# Patient Record
Sex: Female | Born: 1990 | Race: Black or African American | Hispanic: No | Marital: Single | Smoking: Never smoker
Health system: Southern US, Community
[De-identification: ages and names within clinical notes are randomized; demographics above are authoritative.]

## PROBLEM LIST (undated history)

## (undated) HISTORY — PX: TONSILLECTOMY: SUR1361

---

## 2013-09-18 ENCOUNTER — Ambulatory Visit (INDEPENDENT_AMBULATORY_CARE_PROVIDER_SITE_OTHER): Payer: Self-pay | Admitting: General Surgery

## 2013-09-21 ENCOUNTER — Encounter (INDEPENDENT_AMBULATORY_CARE_PROVIDER_SITE_OTHER): Payer: Self-pay | Admitting: General Surgery

## 2013-09-21 ENCOUNTER — Ambulatory Visit (INDEPENDENT_AMBULATORY_CARE_PROVIDER_SITE_OTHER): Payer: Private Health Insurance - Indemnity | Admitting: General Surgery

## 2013-09-21 VITALS — BP 110/60 | HR 70 | Resp 16 | Ht 71.0 in | Wt 255.0 lb

## 2013-09-21 DIAGNOSIS — L0501 Pilonidal cyst with abscess: Secondary | ICD-10-CM

## 2013-09-21 NOTE — Progress Notes (Signed)
Subjective:     Patient ID: Alexis Patterson, female   DOB: August 19, 1990, 23 y.o.   MRN: [id number]  HPI The patient is a 23 year old female who was [facility] by [facility] for an evaluation of a pilonidal cyst. Patient states that the infection started approximately a month ago. She said she had to strain approximately one week and to the infection. She is treated with by mouth antibiotics and has since resolved. She states she's had this once previously in the past but did not require incision and drainage.  She has no tenderness or drainage at this time.  Review of Systems  Constitutional: Negative.   HENT: Negative.   Respiratory: Negative.   Cardiovascular: Negative.   Gastrointestinal: Negative.   Neurological: Negative.   All other systems reviewed and are negative.       Objective:   Physical Exam  Constitutional: She is oriented to person, place, and time. She appears well-developed and well-nourished.  HENT:  Head: Normocephalic and atraumatic.  Eyes: Conjunctivae and EOM are normal. Pupils are equal, round, and reactive to light.  Neck: Normal range of motion. Neck supple.  Cardiovascular: Normal rate, regular rhythm and normal heart sounds.   Pulmonary/Chest: Effort normal and breath sounds normal.  Abdominal: Soft. Bowel sounds are normal. She exhibits no distension and no mass. There is no tenderness. There is no rebound and no guarding.  Musculoskeletal: Normal range of motion.  Neurological: She is alert and oriented to person, place, and time.  Skin: Skin is warm and dry.     Psychiatric: She has a normal mood and affect.       Assessment:     23 year old female with a pilonidal cyst and midline pits. I discussed with her the pathophysiology of the pilonidal cyst the likelihood that this will likely return in the future. We also discussed the possible procedures involved and the likelihood of delayed wound healing. At this time she would like to  discuss any future surgery with her parents initially impossibly plants around her school schedule.     Plan:     1. We'll have patient follow back up as needed

## 2014-04-02 ENCOUNTER — Emergency Department (HOSPITAL_COMMUNITY): Payer: Managed Care, Other (non HMO)

## 2014-04-02 ENCOUNTER — Emergency Department (HOSPITAL_COMMUNITY)
Admission: EM | Admit: 2014-04-02 | Discharge: 2014-04-03 | Disposition: A | Discharge status: Home / Self Care | Payer: Managed Care, Other (non HMO) | Attending: Emergency Medicine | Admitting: Emergency Medicine

## 2014-04-02 ENCOUNTER — Encounter (HOSPITAL_COMMUNITY): Payer: Self-pay | Admitting: Emergency Medicine

## 2014-04-02 DIAGNOSIS — Y9289 Other specified places as the place of occurrence of the external cause: Secondary | ICD-10-CM | POA: Insufficient documentation

## 2014-04-02 DIAGNOSIS — S99919A Unspecified injury of unspecified ankle, initial encounter: Secondary | ICD-10-CM

## 2014-04-02 DIAGNOSIS — X500XXA Overexertion from strenuous movement or load, initial encounter: Secondary | ICD-10-CM | POA: Diagnosis not present

## 2014-04-02 DIAGNOSIS — S8990XA Unspecified injury of unspecified lower leg, initial encounter: Secondary | ICD-10-CM | POA: Insufficient documentation

## 2014-04-02 DIAGNOSIS — Y9368 Activity, volleyball (beach) (court): Secondary | ICD-10-CM | POA: Diagnosis not present

## 2014-04-02 DIAGNOSIS — S86912A Strain of unspecified muscle(s) and tendon(s) at lower leg level, left leg, initial encounter: Secondary | ICD-10-CM

## 2014-04-02 DIAGNOSIS — S99929A Unspecified injury of unspecified foot, initial encounter: Secondary | ICD-10-CM

## 2014-04-02 DIAGNOSIS — IMO0002 Reserved for concepts with insufficient information to code with codable children: Secondary | ICD-10-CM | POA: Diagnosis not present

## 2014-04-02 DIAGNOSIS — M25562 Pain in left knee: Secondary | ICD-10-CM

## 2014-04-02 MED ORDER — HYDROCODONE-ACETAMINOPHEN 5-325 MG PO TABS
2.0000 | ORAL_TABLET | Freq: Once | ORAL | Status: AC
  Administered 2014-04-02: 2 via ORAL
  Filled 2014-04-02: qty 2

## 2014-04-02 NOTE — ED Provider Notes (Signed)
CSN: [id number]     Arrival date & time 04/02/14  2151 History  This chart was scribed for non-physician practitioner working with No att. providers found by Elveria Rising, ED Scribe. This patient was seen in room WTR8/WTR8 and the patient's care was started at 11:32 PM.   Chief Complaint  Patient presents with  . Knee Pain    The history is provided by the patient. No language interpreter was used.   HPI Comments: Alexis Patterson is a 23 y.o. female who presents to the Emergency Department with left knee injury sustained while playing volleyball today 2.5 hours ago. She reports landing on an inverted left foot and feeling her left knee buckle outward. Patient is experiencing pain and swelling. She denies loss of sensation in her left lower leg. Patient has not attempted ambulating since the injury, due to pain severity. Patient reports treatment with ice.   History reviewed. No pertinent past medical history. Past Surgical History  Procedure Laterality Date  . Tonsillectomy     Family History  Problem Relation Age of Onset  . Hypertension Father    History  Substance Use Topics  . Smoking status: Never Smoker   . Smokeless tobacco: Not on file  . Alcohol Use: No   OB History   Grav Para Term Preterm Abortions TAB SAB Ect Mult Living                  Review of Systems  Musculoskeletal: Positive for arthralgias.  Neurological: Negative for weakness and numbness.  All other systems reviewed and are negative.   Allergies  Review of patient's allergies indicates no known allergies.  Home Medications   Prior to Admission medications   Medication Sig Start Date End Date Taking? Authorizing Provider  aspirin-acetaminophen-caffeine (EXCEDRIN MIGRAINE) [phone] MG per tablet Take 1 tablet by mouth every 6 (six) hours as needed for headache.   Yes Historical Provider, MD  HYDROcodone-acetaminophen (NORCO/VICODIN) 5-325 MG per tablet Take 1-2 tablets by mouth every 6 (six) hours as  needed for moderate pain or severe pain. 04/03/14   Antony Madura, PA-C  naproxen (NAPROSYN) 500 MG tablet Take 1 tablet (500 mg total) by mouth 2 (two) times daily. 04/03/14   Antony Madura, PA-C   Triage Vitals: BP 139/70  Pulse 86  Temp(Src) 98.4 F (36.9 C) (Oral)  Resp 18  SpO2 95%  LMP 03/29/2014  Physical Exam  Nursing note and vitals reviewed. Constitutional: She is oriented to person, place, and time. She appears well-developed and well-nourished. No distress.  Nontoxic/nonseptic appearing  HENT:  Head: Normocephalic and atraumatic.  Eyes: Conjunctivae and EOM are normal. No scleral icterus.  Neck: Normal range of motion.  Cardiovascular: Normal rate, regular rhythm and intact distal pulses.   DP and PT pulses 2+ in left lower extremity  Pulmonary/Chest: Effort normal. No respiratory distress.  Musculoskeletal:       Left knee: She exhibits decreased range of motion and swelling. She exhibits no effusion, no deformity, normal alignment and no LCL laxity. Tenderness found. Medial joint line and MCL tenderness noted.  TTP along medial joint line and MCL with mild swelling. No effusion or crepitus. Near full PROM with limited AROM secondary to pain. 5/5 strength against resistance with extension of L knee; 4/5 with flexion.  Neurological: She is alert and oriented to person, place, and time. She exhibits normal muscle tone. Coordination normal.  No gross sensory deficits appreciated  Skin: Skin is warm and dry. No rash noted. She  is not diaphoretic. No erythema. No pallor.  Psychiatric: She has a normal mood and affect. Her behavior is normal.    ED Course  Procedures (including critical care time)  COORDINATION OF CARE: 11:34 PM- Will order knee sleeve and crutches while awaiting imaging. Discussed treatment plan with patient at bedside and patient agreed to plan.   Labs Review Labs Reviewed - No data to display  Imaging Review Dg Knee Complete 4 Views Left  04/03/2014    CLINICAL DATA:  KNEE PAIN. Twisting injury, with anterior knee pain under the patella.  EXAM: LEFT KNEE - COMPLETE 4+ VIEW  COMPARISON:  None.  FINDINGS: Small curvilinear calcific density along the posterior margin of the proximal tibia on the lateral projection. Otherwise, no displaced fracture or dislocation. No aggressive osseous lesion or overt degenerative change. No joint effusion.  IMPRESSION: Small calcific density along the posterior margin of the proximal tibia. This can be sequelae of an age-indeterminate avulsion injury. Correlate for point tenderness.   Electronically Signed   By: Jearld Lesch M.D.   On: 04/03/2014 00:18     EKG Interpretation None      MDM   Final diagnoses:  Medial knee pain, left  Knee strain, left, initial encounter    23 year old female presents to the emergency department for left knee pain after an injury while playing volleyball. Patient neurovascularly intact. No gross sensory deficits appreciated. No crepitus or deformity. No effusion. There is mild swelling along the medial aspect of the left knee with tenderness at the medial joint line and along the MCL. Xray with questionable avulsion of posterior margin of proximal tibia; no aggressive osseous injury.  Patient given knee sleeve and crutches in ED. She is stable for discharge with instruction to followup with an orthopedist. Referral provided. Instruction on RICE and naproxen given and return precautions discussed. Patient agreeable to plan with no unaddressed concerns.  I personally performed the services described in this documentation, which was scribed in my presence. The recorded information has been reviewed and is accurate.   Filed Vitals:   04/02/14 2236  BP: 139/70  Pulse: 86  Temp: 98.4 F (36.9 C)  TempSrc: Oral  Resp: 18  SpO2: 95%     Antony Madura, PA-C 04/03/14 [phone]

## 2014-04-02 NOTE — ED Notes (Signed)
Pt states she injured her left knee playing volleyball today  Pt has swelling noted  Ice pack in place

## 2014-04-03 MED ORDER — NAPROXEN 500 MG PO TABS: 500.0000 mg | ORAL_TABLET | Freq: Two times a day (BID) | ORAL | Status: AC

## 2014-04-03 MED ORDER — ONDANSETRON 8 MG PO TBDP
8.0000 mg | ORAL_TABLET | Freq: Once | ORAL | Status: AC
  Administered 2014-04-03: 8 mg via ORAL
  Filled 2014-04-03: qty 2

## 2014-04-03 MED ORDER — HYDROCODONE-ACETAMINOPHEN 5-325 MG PO TABS: 1.0000 | ORAL_TABLET | Freq: Four times a day (QID) | ORAL | Status: DC | PRN

## 2014-04-03 NOTE — Discharge Instructions (Signed)
Suspect that you have a strain to your medial collateral ligament. Recommend you wear a knee sleeve for stability. Use crutches when walking to prevent from putting weight on your left leg. Continue with elevation, ice to the area 3-4 times per day, and rest. Take naproxen for pain control. You may take Norco if pain is severe. Do not take Tylenol with Norco as there is already Tylenol and this medication. Followup with orthopedics for further evaluation of your injury. Return to the emergency department as needed if symptoms worsen.  Medial Collateral Knee Ligament Sprain  with Phase I Rehab The medial collateral ligament (MCL) of the knee helps hold the knee joint in proper alignment and prevents the bones from shifting out of alignment (displacing) to the inside (medially). Injury to the knee may cause a tear in the MCL ligament (sprain). Sprains may heal without treatment, but this often results in a loose joint. Sprains are classified into three categories. Grade 1 sprains cause pain, but the tendon is not lengthened. Grade 2 sprains include a lengthened ligament, due to the ligament being stretched or partially ruptured. With grade 2 sprains, there is still function, although possibly decreased. Grade 3 sprains involve a complete tear of the tendon or muscle, and function is usually impaired. SYMPTOMS   Pain and tenderness on the inner side of the knee.  A "pop," tearing or pulling sensation at the time of injury.  Bruising (contusion) at the site of injury, within 48 hours of injury.  Knee stiffness.  Limping, often walking with the knee bent. CAUSES  An MCL sprain occurs when a force is placed on the ligament that is greater than it can handle. Common mechanisms of injury include:  Direct hit (trauma) to the outer side of the knee, especially if the foot is planted on the ground.  Forceful pivoting of the body and leg, while the foot is planted on the ground. RISK INCREASES  WITH:  Contact sports (football, rugby).  Sports that require pivoting or cutting (soccer).  Poor knee strength and flexibility.  Improper equipment use. PREVENTION  Warm up and stretch properly before activity.  Maintain physical fitness:  Strength, flexibility and endurance.  Cardiovascular fitness.  Wear properly fitted protective equipment (correct length of cleats for surface).  Functional braces may be effective in preventing injury. PROGNOSIS  MCL tears usually heal without the need for surgery. Sometimes however, surgery is required. RELATED COMPLICATIONS  Frequently recurring symptoms, such as the knee giving way, knee instability or knee swelling.  Injury to other structures in the knee joint:  Meniscal cartilage, resulting in locking and swelling of the knee.  Articular cartilage, resulting in knee arthritis.  Other ligaments of the knee.  Injury to nerves, resulting in numbness of the outer leg, foot or ankle and weakness or paralysis, with inability to raise the ankle or toes.  Knee stiffness. TREATMENT Treatment first involves the use of ice and medicine, to reduce pain and inflammation. The use of strengthening and stretching exercises may help reduce pain with activity. These exercises may be performed at home, but referral to a therapist is often advised. You may be advised to walk with crutches until you are able to walk without a limp. Your caregiver may provide you with a hinged knee brace to help regain a full range of motion, while also protecting the injured knee. For severe MCL injuries or injuries that involve other ligaments of the knee, surgery is often advised. MEDICATION  Do not take  pain medicine for 7 days before surgery.  Only use over-the-counter pain medicine as directed by your caregiver.  Only use prescription pain relievers as directed and only in needed amounts. HEAT AND COLD  Cold treatment (icing) should be applied for 10 to 15  minutes every 2 to 3 hours for inflammation and pain, and immediately after any activity, that aggravates the symptoms. Use ice packs or an ice massage.  Heat treatment may be used before performing stretching and strengthening activities prescribed by your caregiver, physical therapist or athletic trainer. Use a heat pack or warm water soak. SEEK MEDICAL CARE IF:   Symptoms get worse or do not improve in 4 to 6 weeks, despite treatment.  New, unexplained symptoms develop. RICE: Routine Care for Injuries The routine care of many injuries includes Rest, Ice, Compression, and Elevation (RICE). HOME CARE INSTRUCTIONS  Rest is needed to allow your body to heal. Routine activities can usually be resumed when comfortable. Injured tendons and bones can take up to 6 weeks to heal. Tendons are the cord-like structures that attach muscle to bone.  Ice following an injury helps keep the swelling down and reduces pain.  Put ice in a plastic bag.  Place a towel between your skin and the bag.  Leave the ice on for 15-20 minutes, 3-4 times a day, or as directed by your health care provider. Do this while awake, for the first 24 to 48 hours. After that, continue as directed by your caregiver.  Compression helps keep swelling down. It also gives support and helps with discomfort. If an elastic bandage has been applied, it should be removed and reapplied every 3 to 4 hours. It should not be applied tightly, but firmly enough to keep swelling down. Watch fingers or toes for swelling, bluish discoloration, coldness, numbness, or excessive pain. If any of these problems occur, remove the bandage and reapply loosely. Contact your caregiver if these problems continue.  Elevation helps reduce swelling and decreases pain. With extremities, such as the arms, hands, legs, and feet, the injured area should be placed near or above the level of the heart, if possible. SEEK IMMEDIATE MEDICAL CARE IF:  You have  persistent pain and swelling.  You develop redness, numbness, or unexpected weakness.  Your symptoms are getting worse rather than improving after several days. These symptoms may indicate that further evaluation or further X-rays are needed. Sometimes, X-rays may not show a small broken bone (fracture) until 1 week or 10 days later. Make a follow-up appointment with your caregiver. Ask when your X-ray results will be ready. Make sure you get your X-ray results. Document Released: 11/01/2000 Document Revised: 07/25/2013 Document Reviewed: 12/19/2010 [facility] Patient Information 2015 [place], [place]. This information is not intended to replace advice given to you by your health care provider. Make sure you discuss any questions you have with your health care provider.

## 2014-04-03 NOTE — ED Provider Notes (Signed)
Medical screening examination/treatment/procedure(s) were performed by non-physician practitioner and as supervising physician I was immediately available for consultation/collaboration.   EKG Interpretation None      Melquisedec Journey, MD, FACEP   Merly Hinkson L Yutaka Holberg, MD 04/03/14 1505 

## 2015-08-04 HISTORY — PX: KNEE SURGERY: SHX244

## 2019-02-15 ENCOUNTER — Other Ambulatory Visit: Payer: Self-pay

## 2019-02-15 DIAGNOSIS — Z20822 Contact with and (suspected) exposure to covid-19: Secondary | ICD-10-CM

## 2019-02-19 LAB — NOVEL CORONAVIRUS, NAA: SARS-CoV-2, NAA: NOT DETECTED

## 2019-02-22 ENCOUNTER — Telehealth: Payer: Self-pay | Admitting: General Practice

## 2019-02-22 NOTE — Telephone Encounter (Signed)
° °  Reason for call:  Patient informed of negative COVID results and advised to set up My Chart

## 2019-05-25 ENCOUNTER — Ambulatory Visit
Admission: EM | Admit: 2019-05-25 | Discharge: 2019-05-25 | Disposition: A | Discharge status: Home / Self Care | Payer: BC Managed Care – PPO | Attending: Physician Assistant | Admitting: Physician Assistant

## 2019-05-25 ENCOUNTER — Encounter: Payer: Self-pay | Admitting: Emergency Medicine

## 2019-05-25 ENCOUNTER — Other Ambulatory Visit: Payer: Self-pay

## 2019-05-25 DIAGNOSIS — L02411 Cutaneous abscess of right axilla: Secondary | ICD-10-CM | POA: Diagnosis not present

## 2019-05-25 DIAGNOSIS — B9689 Other specified bacterial agents as the cause of diseases classified elsewhere: Secondary | ICD-10-CM | POA: Diagnosis not present

## 2019-05-25 DIAGNOSIS — L0291 Cutaneous abscess, unspecified: Secondary | ICD-10-CM | POA: Insufficient documentation

## 2019-05-25 DIAGNOSIS — L02412 Cutaneous abscess of left axilla: Secondary | ICD-10-CM

## 2019-05-25 DIAGNOSIS — R509 Fever, unspecified: Secondary | ICD-10-CM

## 2019-05-25 DIAGNOSIS — R000 Tachycardia, unspecified: Secondary | ICD-10-CM

## 2019-05-25 MED ORDER — CEPHALEXIN 500 MG PO CAPS: 500.0000 mg | ORAL_CAPSULE | Freq: Four times a day (QID) | ORAL | 0 refills | Status: DC

## 2019-05-25 NOTE — Discharge Instructions (Addendum)
Start keflex as directed. You can remove current dressing in 24 hours. Keep wound clean and dry. You can clean gently with soap and water. Do not soak area in water. Monitor for spreading redness, increased warmth, increased swelling, fever, follow up for reevaluation needed. Follow up in 2 days for recheck.

## 2019-05-25 NOTE — ED Notes (Signed)
Patient able to ambulate independently  

## 2019-05-25 NOTE — ED Triage Notes (Signed)
Patient presents to [facility] for assessment of abscess to bilateral armpit.  Patient prefers not to have them opened up if she's able, but would like the pain, so is willing to do a draining if needed.

## 2019-05-25 NOTE — ED Provider Notes (Signed)
[facility]    CSN: [id number] Arrival date & time: 05/25/19  1359      History   Chief Complaint Chief Complaint  Patient presents with  . Abscess    HPI Alexis Patterson is a 28 y.o. female.   29 year old female comes in for abscesses to bilateral underarms.  States this started a few days ago, and has been increasing in size.  She has tried to do Epson salt baths, warm rag compresses without relief.  She denies any fever, chills, body aches.  Has noticed some erythema to the left underarms without spreading.  She states recently started waxing area on her own, and has also switched to multiple products, including deodorant, body wash to natural products.     History reviewed. No pertinent past medical history.  There are no active problems to display for this patient.   Past Surgical History:  Procedure Laterality Date  . KNEE SURGERY Left 2017  . TONSILLECTOMY      OB History   No obstetric history on file.      Home Medications    Prior to Admission medications   Medication Sig Start Date End Date Taking? Authorizing Provider  aspirin-acetaminophen-caffeine (EXCEDRIN MIGRAINE) [phone] MG per tablet Take 1 tablet by mouth every 6 (six) hours as needed for headache.    [provider]  cephALEXin (KEFLEX) 500 MG capsule Take 1 capsule (500 mg total) by mouth 4 (four) times daily. 05/25/19   Tasia Catchings,  V, PA-C  naproxen (NAPROSYN) 500 MG tablet Take 1 tablet (500 mg total) by mouth 2 (two) times daily. 04/03/14   Antonietta Breach, PA-C    Family History Family History  Problem Relation Age of Onset  . Hypertension Father     Social History Social History   Tobacco Use  . Smoking status: Never Smoker  . Smokeless tobacco: Never Used  Substance Use Topics  . Alcohol use: No  . Drug use: No     Allergies   Patient has no known allergies.   Review of Systems Review of Systems  Reason unable to perform ROS: See HPI as above.      Physical Exam Triage Vital Signs ED Triage Vitals  Enc Vitals Group     BP 05/25/19 1408 131/84     Pulse Rate 05/25/19 1408 (!) 112     Resp 05/25/19 1408 18     Temp 05/25/19 1408 (!) 100.5 F (38.1 C)     Temp Source 05/25/19 1408 Oral     SpO2 05/25/19 1408 98 %     Weight --      Height --      Head Circumference --      Peak Flow --      Pain Score 05/25/19 1407 6     Pain Loc --      Pain Edu? --      Excl. in [place]? --    No data found.  Updated Vital Signs BP 131/84 (BP Location: Left Arm)   Pulse (!) 112   Temp (!) 100.5 F (38.1 C) (Oral)   Resp 18   SpO2 98%   Physical Exam Constitutional:      General: She is not in acute distress.    Appearance: She is well-developed. She is not diaphoretic.  HENT:     Head: Normocephalic and atraumatic.  Eyes:     Conjunctiva/sclera: Conjunctivae normal.     Pupils: Pupils are equal, round, and reactive to  light.  Pulmonary:     Effort: Pulmonary effort is normal. No respiratory distress.  Skin:    General: Skin is warm and dry.     Comments: Right axilla: 2 abscesses felt. Superior abscess- 1cm x 0.5cm, open wound with self drainage, no surrounding erythema. Tenderness to palpation. Inferior abscess- 0.5cm x 0.5cm mobile hard, tenderness to palpation without surrounding erythema.  Left axilla: 2 abscesses felt Superior abscess- 2cm x 2cm with surrounding cellulitis extending up the medial aspect of left upper arm. No tracking noted.  Inferior abscess- 1cm x 0.5cm abscess with open wound, self drainage. Surrounding induration about 2cm x 1cm. Mild tenderness to palpation. No surrounding erythema.  Neurological:     Mental Status: She is alert and oriented to person, place, and time.      UC Treatments / Results  Labs (all labs ordered are listed, but only abnormal results are displayed) Labs Reviewed  AEROBIC CULTURE (SUPERFICIAL SPECIMEN)    EKG   Radiology No results found.  Procedures Incision and  Drainage  Date/Time: 05/25/2019 4:05 PM Performed by: Belinda FisherYu,  V, PA-C Authorized by: Belinda FisherYu,  V, PA-C   Consent:    Consent obtained:  Verbal   Consent given by:  Patient   Risks discussed:  Bleeding, incomplete drainage, pain and infection   Alternatives discussed:  Alternative treatment and referral Location:    Type:  Abscess   Size:  Right axilla: 1x0.5cm, 0.5x0.5cm; Left axilla: 2x2cm, 1x0.5cm   Location:  Upper extremity   Upper extremity location: axilla. Pre-procedure details:    Skin preparation:  Hibiclens Anesthesia (see MAR for exact dosages):    Anesthesia method:  Local infiltration   Local anesthetic:  Lidocaine 2% WITH epi Procedure type:    Complexity:  Complex Procedure details:    Needle aspiration: no     Scalpel blade:  11 Comments:     Right axilla: Superior 1x0.5cm- self draining wound. Purulent drainage. After local anesthetic, abscess probed and deloculated.  Inferior 0.5x0.5cm- stab incision, dermal depth, area irrigated with saline. Moderate purulent drainage.  Left axilla: Superior 2x2cm- single straight incision, subcutaneous depth, wound probed and deloculated, copious purulent drainage.  Inferior 1x0.5cm- open wound extended to single straight incision. Wound probed and deloculated, moderate purulent drainage.   All wounds left open without drains. Dressed with antibiotic ointment with dressing. Patient tolerated well with no immediate complications.    (including critical care time)  Medications Ordered in UC Medications - No data to display  Initial Impression / Assessment and Plan / UC Course  I have reviewed the triage vital signs and the nursing notes.  Pertinent labs & imaging results that were available during my care of the patient were reviewed by me and considered in my medical decision making (see chart for details).    Patient febrile at 100.5, tachycardic at 112 during triage.  Patient tolerated procedure well. Left axilla  with significant cellulitis without tracking.  Cellulitis marked with sterile pen.  Wound culture obtained.  Will start patient on Keflex as directed.  Symptomatic treatment discussed.  Wound care instructions provided.  Patient to follow-up in 2 days for recheck.  Otherwise return precautions given.  Patient expresses understanding and agrees to plan.  Final Clinical Impressions(s) / UC Diagnoses   Final diagnoses:  Abscess of multiple sites    ED Prescriptions    Medication Sig Dispense Auth. Provider   cephALEXin (KEFLEX) 500 MG capsule Take 1 capsule (500 mg total) by mouth 4 (four)  times daily. 28 capsule Belinda Fisher, PA-C     PDMP not reviewed this encounter.   Belinda Fisher, PA-C 05/25/19 1616

## 2019-05-27 ENCOUNTER — Other Ambulatory Visit: Payer: Self-pay

## 2019-05-27 ENCOUNTER — Ambulatory Visit
Admission: EM | Admit: 2019-05-27 | Discharge: 2019-05-27 | Disposition: A | Discharge status: Home / Self Care | Payer: BC Managed Care – PPO

## 2019-05-27 ENCOUNTER — Encounter: Payer: Self-pay | Admitting: Emergency Medicine

## 2019-05-27 DIAGNOSIS — L02412 Cutaneous abscess of left axilla: Secondary | ICD-10-CM

## 2019-05-27 DIAGNOSIS — L02419 Cutaneous abscess of limb, unspecified: Secondary | ICD-10-CM

## 2019-05-27 DIAGNOSIS — S40819D Abrasion of unspecified upper arm, subsequent encounter: Secondary | ICD-10-CM

## 2019-05-27 LAB — AEROBIC CULTURE W GRAM STAIN (SUPERFICIAL SPECIMEN)

## 2019-05-27 LAB — AEROBIC CULTURE? (SUPERFICIAL SPECIMEN)

## 2019-05-27 NOTE — ED Notes (Signed)
Patient able to ambulate independently  

## 2019-05-27 NOTE — ED Triage Notes (Signed)
Pt presents to [facility] for follow up after 2 days post abscess drainage to bilateral underarms.  Patient states she feels much better, vitals WNL today

## 2019-05-27 NOTE — ED Provider Notes (Signed)
[facility]    CSN: [id number] Arrival date & time: 05/27/19  1304      History   Chief Complaint Chief Complaint  Patient presents with  . Abscess    HPI Alexis Patterson is a 28 y.o. female presented for review for evaluation of bilateral axillary abscesses.  Patient seen on 10/22 initially, records reviewed by me at time of appointment: Please see HPI from this encounter.  Patient was found to be febrile, tachycardic at that time.  Patient underwent I&D of bilateral axillary lesions, which she tolerated well.  Patient given Keflex 4 times daily x7 days at time of discharge.  Since then, patient states she has been compliant with her Keflex, reporting improvement in pain, appearance.    History reviewed. No pertinent past medical history.  There are no active problems to display for this patient.   Past Surgical History:  Procedure Laterality Date  . KNEE SURGERY Left 2017  . TONSILLECTOMY      OB History   No obstetric history on file.      Home Medications    Prior to Admission medications   Medication Sig Start Date End Date Taking? Authorizing Provider  aspirin-acetaminophen-caffeine (EXCEDRIN MIGRAINE) [phone] MG per tablet Take 1 tablet by mouth every 6 (six) hours as needed for headache.    [provider]  cephALEXin (KEFLEX) 500 MG capsule Take 1 capsule (500 mg total) by mouth 4 (four) times daily. 05/25/19   Cathie Hoops, Amy V, PA-C  naproxen (NAPROSYN) 500 MG tablet Take 1 tablet (500 mg total) by mouth 2 (two) times daily. 04/03/14   Antony Madura, PA-C    Family History Family History  Problem Relation Age of Onset  . Hypertension Father     Social History Social History   Tobacco Use  . Smoking status: Never Smoker  . Smokeless tobacco: Never Used  Substance Use Topics  . Alcohol use: No  . Drug use: No     Allergies   Patient has no known allergies.   Review of Systems Review of Systems  Constitutional: Negative for  fatigue and fever.  HENT: Negative for ear pain, sinus pain, sore throat and voice change.   Eyes: Negative for pain, redness and visual disturbance.  Respiratory: Negative for cough and shortness of breath.   Cardiovascular: Negative for chest pain and palpitations.  Gastrointestinal: Negative for abdominal pain, diarrhea and vomiting.  Musculoskeletal: Negative for arthralgias and myalgias.  Skin: Positive for wound. Negative for rash.  Neurological: Negative for syncope and headaches.     Physical Exam Triage Vital Signs ED Triage Vitals  Enc Vitals Group     BP 05/27/19 1308 110/75     Pulse Rate 05/27/19 1308 80     Resp 05/27/19 1308 16     Temp 05/27/19 1308 99.1 F (37.3 C)     Temp Source 05/27/19 1308 Oral     SpO2 05/27/19 1308 98 %     Weight --      Height --      Head Circumference --      Peak Flow --      Pain Score 05/27/19 1310 4     Pain Loc --      Pain Edu? --      Excl. in [place]? --    No data found.  Updated Vital Signs BP 110/75 (BP Location: Left Arm)   Pulse 80   Temp 99.1 F (37.3 C) (Oral)   Resp  16   SpO2 98%   Visual Acuity Right Eye Distance:   Left Eye Distance:   Bilateral Distance:    Right Eye Near:   Left Eye Near:    Bilateral Near:     Physical Exam Constitutional:      General: She is not in acute distress. HENT:     Head: Normocephalic and atraumatic.  Eyes:     General: No scleral icterus.    Pupils: Pupils are equal, round, and reactive to light.  Cardiovascular:     Rate and Rhythm: Normal rate.  Pulmonary:     Effort: Pulmonary effort is normal.  Skin:    Comments: Right axilla without underlying erythema, warmth, TTP. Left axilla with 2 open wounds from I&D that are without foreign body.  Superior lesion actively draining purulent, malodorous discharge.  Line of erythema from previous visit marked, no underlying erythema/warmth at this time.  Mild TTP over areas of induration near incisions.  Neurological:      Mental Status: She is alert and oriented to person, place, and time.      UC Treatments / Results  Labs (all labs ordered are listed, but only abnormal results are displayed) Labs Reviewed - No data to display  EKG   Radiology No results found.  Procedures Procedures (including critical care time)  Medications Ordered in UC Medications - No data to display  Initial Impression / Assessment and Plan / UC Course  I have reviewed the triage vital signs and the nursing notes.  Pertinent labs & imaging results that were available during my care of the patient were reviewed by me and considered in my medical decision making (see chart for details).     Patient improving: Heart rate has normalized (80 in office today), temperature now down to 99.1.  Patient to continue Keflex as prescribed.  She does importance of adding hot compresses to current regimen.  Return precautions discussed, patient verbalized understanding and is agreeable to plan. Final Clinical Impressions(s) / UC Diagnoses   Final diagnoses:  Axillary abscess     Discharge Instructions     Keep the area clean and dry. Use hot compresses for 5 minutes 3-4 times a day. Take antibiotic as prescribed with food.  Avoid antiperspirants - look for deodorants without aluminum. Avoid wearing underwire bras as this can irritate the area further.     ED Prescriptions    None     PDMP not reviewed this encounter.   Hall-Potvin, [place], [place] 05/27/19 1413

## 2019-05-27 NOTE — Discharge Instructions (Addendum)
Keep the area clean and dry. °Use hot compresses for 5 minutes 3-4 times a day. °Take antibiotic as prescribed with food. ° °Avoid antiperspirants - look for deodorants without aluminum. °Avoid wearing underwire bras as this can irritate the area further.  °

## 2019-05-29 ENCOUNTER — Telehealth (HOSPITAL_COMMUNITY): Payer: Self-pay | Admitting: Emergency Medicine

## 2019-05-29 MED ORDER — DOXYCYCLINE HYCLATE 100 MG PO CAPS: 100.0000 mg | ORAL_CAPSULE | Freq: Two times a day (BID) | ORAL | 0 refills | Status: AC

## 2019-05-29 NOTE — Telephone Encounter (Signed)
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 Pt contacted and made aware of lab results, per Loura Halt, to switch to Doxy 100mg  BID x7 days. Pt agreeable to plan and verbalized understanding.

## 2019-06-05 ENCOUNTER — Encounter: Payer: Self-pay | Admitting: Emergency Medicine

## 2019-06-05 ENCOUNTER — Ambulatory Visit
Admission: EM | Admit: 2019-06-05 | Discharge: 2019-06-05 | Disposition: A | Discharge status: Home / Self Care | Payer: BC Managed Care – PPO | Attending: Emergency Medicine | Admitting: Emergency Medicine

## 2019-06-05 ENCOUNTER — Other Ambulatory Visit: Payer: Self-pay

## 2019-06-05 DIAGNOSIS — Z20828 Contact with and (suspected) exposure to other viral communicable diseases: Secondary | ICD-10-CM | POA: Diagnosis not present

## 2019-06-05 DIAGNOSIS — Z20822 Contact with and (suspected) exposure to covid-19: Secondary | ICD-10-CM

## 2019-06-05 NOTE — Discharge Instructions (Addendum)
Your COVID test is pending: Is important you quarantine at home until your results are back. °You may take OTC Tylenol for fever and myalgias.  It is important to drink plenty of water throughout the day to stay hydrated. °If you test positive and later develop severe fever, cough, or shortness of breath, it is recommended that you go to the ER for further evaluation. °

## 2019-06-05 NOTE — ED Triage Notes (Signed)
Pt presents to [facility] after roommate tested poitive for COVID Monday of last week.  Told by contact tracing to wait 6 days and then be tested.  Denies any sy,ptoms at this time

## 2019-06-05 NOTE — ED Notes (Signed)
Patient able to ambulate independently  

## 2019-06-05 NOTE — ED Provider Notes (Signed)
[facility]    CSN: [id number] Arrival date & time: 06/05/19  1158      History   Chief Complaint Chief Complaint  Patient presents with  . COVID Exposure    HPI Alexis Patterson is a 28 y.o. female presenting for Covid testing due to her remain testing positive Monday of last week.  Patient has been Nature conservation officer, working from home.  No fever symptoms.  In contact with [employer] contact tracing: Advised to be tested after 6 days.   History reviewed. No pertinent past medical history.  There are no active problems to display for this patient.   Past Surgical History:  Procedure Laterality Date  . KNEE SURGERY Left 2017  . TONSILLECTOMY      OB History   No obstetric history on file.      Home Medications    Prior to Admission medications   Medication Sig Start Date End Date Taking? Authorizing Provider  aspirin-acetaminophen-caffeine (EXCEDRIN MIGRAINE) [phone] MG per tablet Take 1 tablet by mouth every 6 (six) hours as needed for headache.    [provider]  doxycycline (VIBRAMYCIN) 100 MG capsule Take 1 capsule (100 mg total) by mouth 2 (two) times daily for 7 days. 05/29/19 06/05/19  Dahlia Byes A, NP  naproxen (NAPROSYN) 500 MG tablet Take 1 tablet (500 mg total) by mouth 2 (two) times daily. 04/03/14   Antony Madura, PA-C    Family History Family History  Problem Relation Age of Onset  . Hypertension Father     Social History Social History   Tobacco Use  . Smoking status: Never Smoker  . Smokeless tobacco: Never Used  Substance Use Topics  . Alcohol use: No  . Drug use: No     Allergies   Patient has no known allergies.   Review of Systems Review of Systems  Constitutional: Negative for fatigue and fever.  HENT: Negative for congestion, dental problem, ear pain, facial swelling, hearing loss, sinus pain, sore throat, trouble swallowing and voice change.   Eyes: Negative for photophobia, pain, redness and visual disturbance.   Respiratory: Negative for cough and shortness of breath.   Cardiovascular: Negative for chest pain and palpitations.  Gastrointestinal: Negative for abdominal pain, diarrhea and vomiting.  Musculoskeletal: Negative for arthralgias and myalgias.  Skin: Negative for rash and wound.  Neurological: Negative for dizziness, syncope and headaches.     Physical Exam Triage Vital Signs ED Triage Vitals  Enc Vitals Group     BP 06/05/19 1229 122/82     Pulse Rate 06/05/19 1229 64     Resp 06/05/19 1229 16     Temp 06/05/19 1229 98.1 F (36.7 C)     Temp Source 06/05/19 1229 Temporal     SpO2 06/05/19 1229 95 %     Weight --      Height --      Head Circumference --      Peak Flow --      Pain Score 06/05/19 1230 0     Pain Loc --      Pain Edu? --      Excl. in GC? --    No data found.  Updated Vital Signs BP 122/82 (BP Location: Left Arm)   Pulse 64   Temp 98.1 F (36.7 C) (Temporal)   Resp 16   SpO2 95%   Visual Acuity Right Eye Distance:   Left Eye Distance:   Bilateral Distance:    Right Eye Near:  Left Eye Near:    Bilateral Near:     Physical Exam Constitutional:      General: She is not in acute distress. HENT:     Head: Normocephalic and atraumatic.  Eyes:     General: No scleral icterus.    Pupils: Pupils are equal, round, and reactive to light.  Cardiovascular:     Rate and Rhythm: Normal rate.  Pulmonary:     Effort: Pulmonary effort is normal.  Skin:    Coloration: Skin is not jaundiced or pale.  Neurological:     Mental Status: She is alert and oriented to person, place, and time.      UC Treatments / Results  Labs (all labs ordered are listed, but only abnormal results are displayed) Labs Reviewed  NOVEL CORONAVIRUS, NAA    EKG   Radiology No results found.  Procedures Procedures (including critical care time)  Medications Ordered in UC Medications - No data to display  Initial Impression / Assessment and Plan / UC Course   I have reviewed the triage vital signs and the nursing notes.  Pertinent labs & imaging results that were available during my care of the patient were reviewed by me and considered in my medical decision making (see chart for details).      Final Clinical Impressions(s) / UC Diagnoses   Final diagnoses:  Exposure to COVID-19 virus     Discharge Instructions     Your COVID test is pending: Is important you quarantine at home until your results are back. You may take OTC Tylenol for fever and myalgias.  It is important to drink plenty of water throughout the day to stay hydrated. If you test positive and later develop severe fever, cough, or shortness of breath, it is recommended that you go to the ER for further evaluation.    ED Prescriptions    None     PDMP not reviewed this encounter.   Hall-Potvin, [place], [place] 06/05/19 1306

## 2019-06-07 LAB — NOVEL CORONAVIRUS, NAA: SARS-CoV-2, NAA: NOT DETECTED

## 2019-08-22 ENCOUNTER — Other Ambulatory Visit: Payer: Self-pay

## 2019-08-22 ENCOUNTER — Ambulatory Visit
Admission: EM | Admit: 2019-08-22 | Discharge: 2019-08-22 | Disposition: A | Discharge status: Home / Self Care | Payer: BC Managed Care – PPO | Attending: Physician Assistant | Admitting: Physician Assistant

## 2019-08-22 ENCOUNTER — Ambulatory Visit (INDEPENDENT_AMBULATORY_CARE_PROVIDER_SITE_OTHER): Payer: BC Managed Care – PPO

## 2019-08-22 DIAGNOSIS — M79675 Pain in left toe(s): Secondary | ICD-10-CM

## 2019-08-22 DIAGNOSIS — S90932A Unspecified superficial injury of left great toe, initial encounter: Secondary | ICD-10-CM | POA: Diagnosis not present

## 2019-08-22 NOTE — ED Triage Notes (Signed)
Pt states dancing bare footed last week and hurt lt great toe. States pain is no better after icing.

## 2019-08-22 NOTE — ED Provider Notes (Signed)
[facility]    CSN: [id number] Arrival date & time: 08/22/19  1134      History   Chief Complaint Chief Complaint  Patient presents with  . Toe Pain    HPI Alexis Patterson is a 29 y.o. female.   29 year old female comes in for 1 week history of left great toe pain after injury. States was dancing barefooted when she jammed her toe. She had swelling when injury first started. After ice compress and symptomatic treatment, continues to have pain to the toe and decreased ROM and therefore came in for evaluation. Swelling has since then improved. Denies numbness/tingling.      History reviewed. No pertinent past medical history.  There are no problems to display for this patient.   Past Surgical History:  Procedure Laterality Date  . KNEE SURGERY Left 2017  . TONSILLECTOMY      OB History   No obstetric history on file.      Home Medications    Prior to Admission medications   Medication Sig Start Date End Date Taking? Authorizing Provider  aspirin-acetaminophen-caffeine (EXCEDRIN MIGRAINE) [phone] MG per tablet Take 1 tablet by mouth every 6 (six) hours as needed for headache.    [provider]  naproxen (NAPROSYN) 500 MG tablet Take 1 tablet (500 mg total) by mouth 2 (two) times daily. 04/03/14   Antonietta Breach, PA-C    Family History Family History  Problem Relation Age of Onset  . Hypertension Father     Social History Social History   Tobacco Use  . Smoking status: Never Smoker  . Smokeless tobacco: Never Used  Substance Use Topics  . Alcohol use: No  . Drug use: No     Allergies   Patient has no known allergies.   Review of Systems Review of Systems  Reason unable to perform ROS: See HPI as above.     Physical Exam Triage Vital Signs ED Triage Vitals  Enc Vitals Group     BP      Pulse      Resp      Temp      Temp src      SpO2      Weight      Height      Head Circumference      Peak Flow      Pain Score        Pain Loc      Pain Edu?      Excl. in [place]?    No data found.  Updated Vital Signs BP (!) 154/81 (BP Location: Right Arm)   Pulse 78   Temp 98.5 F (36.9 C)   Resp 18   SpO2 99%   Physical Exam Constitutional:      General: She is not in acute distress.    Appearance: Normal appearance. She is well-developed. She is not toxic-appearing or diaphoretic.  HENT:     Head: Normocephalic and atraumatic.  Eyes:     Conjunctiva/sclera: Conjunctivae normal.     Pupils: Pupils are equal, round, and reactive to light.  Pulmonary:     Effort: Pulmonary effort is normal. No respiratory distress.     Comments: Speaking in full sentences without difficulty Musculoskeletal:     Cervical back: Normal range of motion and neck supple.     Comments: Mild swelling, no contusion to the left great toe. No erythema, warmth. Tenderness to palpation of distal 1st MTP and along 1st  great toe. Decreased flexion of the toe. Pedal pulse 2+  Skin:    General: Skin is warm and dry.  Neurological:     Mental Status: She is alert and oriented to person, place, and time.      UC Treatments / Results  Labs (all labs ordered are listed, but only abnormal results are displayed) Labs Reviewed - No data to display  EKG   Radiology DG Foot Complete Left  Result Date: 08/22/2019 CLINICAL DATA:  Injury to great toe EXAM: LEFT FOOT - COMPLETE 3+ VIEW COMPARISON:  None. FINDINGS: Alignment is anatomic. There is no acute fracture. Joint spaces are preserved. IMPRESSION: No acute osseous abnormality. Electronically Signed   By: Guadlupe Spanish M.D.   On: 08/22/2019 12:29    Procedures Procedures (including critical care time)  Medications Ordered in UC Medications - No data to display  Initial Impression / Assessment and Plan / UC Course  I have reviewed the triage vital signs and the nursing notes.  Pertinent labs & imaging results that were available during my care of the patient were reviewed by me  and considered in my medical decision making (see chart for details).    Xray negative. Will provide symptomatic treatment with post op boot. Otherwise NSAIDs, ice compress, rest. Follow up with orthopedics if symptoms not improving. Return precautions given.  Final Clinical Impressions(s) / UC Diagnoses   Final diagnoses:  Great toe pain, left    ED Prescriptions    None     PDMP not reviewed this encounter.   Belinda Fisher, PA-C 08/22/19 1256

## 2019-08-22 NOTE — Discharge Instructions (Signed)
As discussed, xray reviewed by me without obvious bone breaks, if radiology reading is different, I will give you a call. Otherwise, post op boot during activity. Ice compress, elevation. Ibuprofen 800mg  three times a day. Follow up with orthopedics if symptoms not improving.

## 2020-02-28 ENCOUNTER — Other Ambulatory Visit: Payer: BC Managed Care – PPO

## 2020-05-31 ENCOUNTER — Emergency Department (HOSPITAL_COMMUNITY)
Admission: EM | Admit: 2020-05-31 | Discharge: 2020-05-31 | Disposition: A | Discharge status: Home / Self Care | Payer: BC Managed Care – PPO | Attending: Emergency Medicine | Admitting: Emergency Medicine

## 2020-05-31 ENCOUNTER — Other Ambulatory Visit: Payer: Self-pay

## 2020-05-31 DIAGNOSIS — Z7982 Long term (current) use of aspirin: Secondary | ICD-10-CM | POA: Insufficient documentation

## 2020-05-31 DIAGNOSIS — M79662 Pain in left lower leg: Secondary | ICD-10-CM | POA: Diagnosis present

## 2020-05-31 DIAGNOSIS — L02416 Cutaneous abscess of left lower limb: Secondary | ICD-10-CM | POA: Diagnosis not present

## 2020-05-31 MED ORDER — CLINDAMYCIN HCL 150 MG PO CAPS: 300.0000 mg | ORAL_CAPSULE | Freq: Three times a day (TID) | ORAL | 0 refills | Status: AC

## 2020-05-31 NOTE — ED Triage Notes (Signed)
Pt arrives POV for "spider bite" that occurred last Friday to R calf.  Pt reports teledoc visit with cephalexin prescribed on Monday with med compliance. Earlier in the evening pt noticed the spot to be more red and swollen. Pt NAD during triage.

## 2020-05-31 NOTE — Discharge Instructions (Signed)
Take clindamycin for antibiotics. Soak two to three times daily. Tylenol and motrin for pain. Return for worsening signs or symptoms, if you do okay recheck in 48 hrs.

## 2020-05-31 NOTE — ED Provider Notes (Signed)
[facility] EMERGENCY DEPARTMENT Provider Note   CSN: [id number] Arrival date & time: 05/31/20  0427     History Chief Complaint  Patient presents with   Insect Bite    Alexis Patterson is a 29 y.o. female.  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  Started on Friday and was put on Keflex.  Patient has not had any improvement.  No fevers chills or vomiting.  Patient currently not pregnant and not trying to get pregnant.  No other significant history, no diabetes.        No past medical history on file.  There are no problems to display for this patient.   Past Surgical History:  Procedure Laterality Date   KNEE SURGERY Left 2017   TONSILLECTOMY       OB History   No obstetric history on file.     Family History  Problem Relation Age of Onset   Hypertension Father     Social History   Tobacco Use   Smoking status: Never Smoker   Smokeless tobacco: Never Used  Substance Use Topics   Alcohol use: No   Drug use: No    Home Medications Prior to Admission medications   Medication Sig Start Date End Date Taking? Authorizing Provider  aspirin-acetaminophen-caffeine (EXCEDRIN MIGRAINE) [phone] MG per tablet Take 1 tablet by mouth every 6 (six) hours as needed for headache.    [provider]  clindamycin (CLEOCIN) 150 MG capsule Take 2 capsules (300 mg total) by mouth 3 (three) times daily for 7 days. 05/31/20 06/07/20  Blane Ohara, MD  naproxen (NAPROSYN) 500 MG tablet Take 1 tablet (500 mg total) by mouth 2 (two) times daily. 04/03/14   Antony Madura, PA-C    Allergies    Patient has no known allergies.  Review of Systems   Review of Systems  Constitutional: Negative for chills and fever.  HENT: Negative for congestion.   Eyes: Negative for visual disturbance.  Respiratory: Negative for shortness of breath.   Cardiovascular: Negative for chest  pain.  Gastrointestinal: Negative for abdominal pain and vomiting.  Genitourinary: Negative for dysuria and flank pain.  Musculoskeletal: Negative for back pain, neck pain and neck stiffness.  Skin: Positive for rash and wound.  Neurological: Negative for light-headedness and headaches.    Physical Exam Updated Vital Signs BP 129/70 (BP Location: Right Arm)    Pulse 84    Temp 98.4 F (36.9 C) (Oral)    Resp 18    SpO2 100%   Physical Exam Vitals and nursing note reviewed.  Constitutional:      Appearance: She is well-developed.  HENT:     Head: Normocephalic and atraumatic.  Eyes:     General:        Right eye: No discharge.        Left eye: No discharge.     Conjunctiva/sclera: Conjunctivae normal.  Neck:     Trachea: No tracheal deviation.  Cardiovascular:     Rate and Rhythm: Normal rate.  Pulmonary:     Effort: Pulmonary effort is normal.  Abdominal:     General: There is no distension.     Palpations: Abdomen is soft.     Tenderness: There is no abdominal tenderness. There is no guarding.  Musculoskeletal:        General: Swelling and tenderness present.     Cervical back: Normal range of motion  and neck supple.  Skin:    General: Skin is warm.     Capillary Refill: Capillary refill takes less than 2 seconds.     Findings: Erythema and rash present.     Comments: Patient has warmth and erythema approximately 10 cm in length right calf region with central pustule approximate 1.5 cm.  No crepitus.  Mild tender to palpation.  Mild induration centrally.  Neurological:     General: No focal deficit present.     Mental Status: She is alert and oriented to person, place, and time.  Psychiatric:        Mood and Affect: Mood normal.     ED Results / Procedures / Treatments   Labs (all labs ordered are listed, but only abnormal results are displayed) Labs Reviewed - No data to display  EKG None  Radiology No results found.  Procedures .Marland KitchenIncision and  Drainage  Date/Time: 05/31/2020 7:46 AM Performed by: Blane Ohara, MD Authorized by: Blane Ohara, MD   Consent:    Consent obtained:  Verbal   Consent given by:  Patient   Risks discussed:  Bleeding, incomplete drainage, infection, damage to other organs and pain   Alternatives discussed:  No treatment Location:    Type:  Abscess   Size:  2 cm   Location:  Lower extremity   Lower extremity location:  Leg   Leg location:  L lower leg Pre-procedure details:    Skin preparation:  Chloraprep Anesthesia (see MAR for exact dosages):    Anesthesia method:  None Procedure type:    Complexity:  Simple Procedure details:    Needle aspiration: yes     Needle size:  18 G   Wound management:  Probed and deloculated   Drainage:  Purulent   Drainage amount:  Moderate   Wound treatment:  Wound left open   Packing materials:  None Post-procedure details:    Patient tolerance of procedure:  Tolerated well, no immediate complications   (including critical care time)  Medications Ordered in ED Medications - No data to display  ED Course  I have reviewed the triage vital signs and the nursing notes.  Pertinent labs & imaging results that were available during my care of the patient were reviewed by me and considered in my medical decision making (see chart for details).    MDM Rules/Calculators/A&P                          Patient presents with signs of early abscess and cellulitis likely MRSA with history of MRSA and no improvement on her Keflex.  18-gauge needle used as pustule very superficial and able to drain without difficulty.  Discussed clindamycin and reassessment in 48 hours as if no improvement she will need deeper/incision and drainage.  Patient has no systemic signs or symptoms.    Final Clinical Impression(s) / ED Diagnoses Final diagnoses:  Cellulitis and abscess of left leg    Rx / DC Orders ED Discharge Orders         Ordered    clindamycin (CLEOCIN) 150  MG capsule  3 times daily        05/31/20 0732           Blane Ohara, MD 05/31/20 [phone]

## 2020-05-31 NOTE — ED Notes (Signed)
2X2 With gauze and paper tape placed on patient wound on left lower leg.

## 2020-06-15 IMAGING — DX DG FOOT COMPLETE 3+V*L*
3 series · 3 of 3 positions shown · non-contrast
Comparison: None.

CLINICAL DATA: Injury to great toe

EXAM:
LEFT FOOT - COMPLETE 3+ VIEW

[foot supine dp]
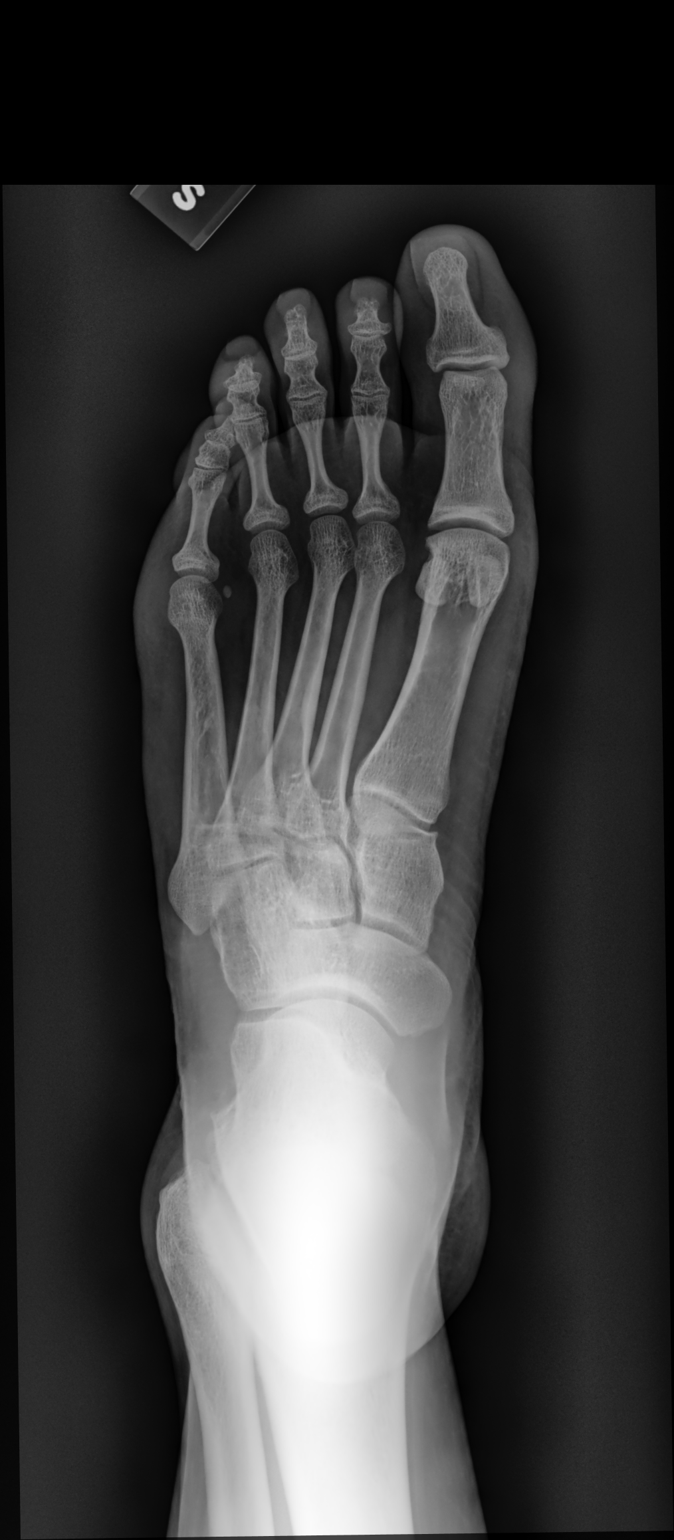

[foot medial oblique]
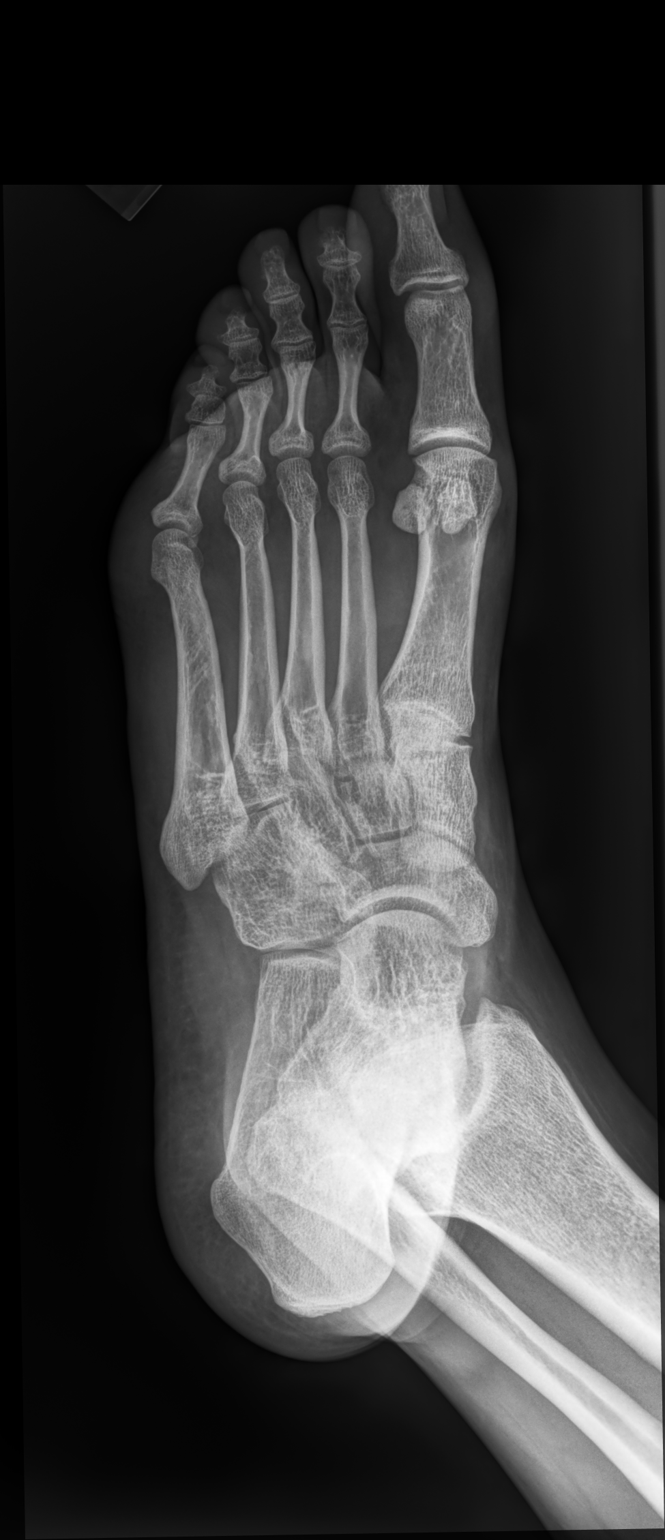

[foot supine lat]
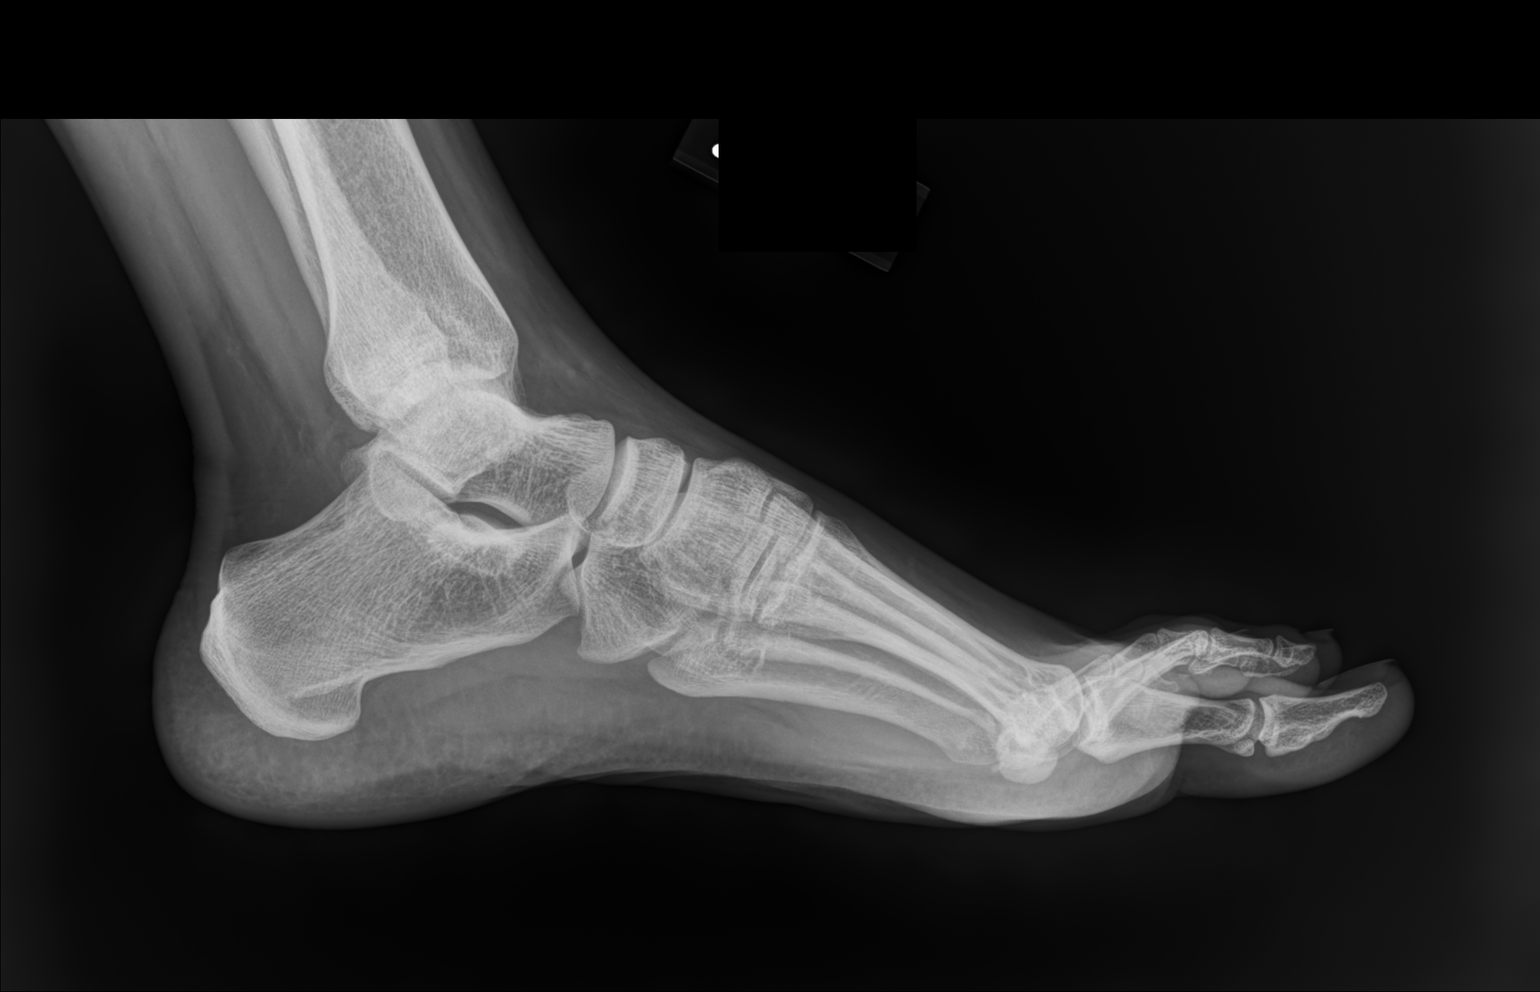

[3 of 3 positions shown; findings below may reference images not displayed]

FINDINGS: Alignment is anatomic. There is no acute fracture. Joint spaces are
preserved.
IMPRESSION: No acute osseous abnormality.

## 2023-11-01 LAB — GLUCOSE, POCT (MANUAL RESULT ENTRY): POC Glucose: 74 mg/dL (ref 70–99)

## 2023-11-01 NOTE — Congregational Nurse Program (Signed)
 Seen at [facility], during employee health screening.  Will followup and help find pcp.  Juliann Pulse, RN, Congregational Nurse, [phone]
